# Patient Record
Sex: Female | Born: 1995 | Race: Black or African American | Hispanic: No | Marital: Single | Smoking: Current some day smoker
Health system: Southern US, Community
[De-identification: ages and names within clinical notes are randomized; demographics above are authoritative.]

## PROBLEM LIST (undated history)

## (undated) ENCOUNTER — Emergency Department (HOSPITAL_COMMUNITY): Admission: EM | Payer: Self-pay | Source: Home / Self Care

## (undated) HISTORY — PX: TONSILLECTOMY: SUR1361

---

## 2016-10-01 ENCOUNTER — Encounter (HOSPITAL_COMMUNITY): Payer: Self-pay | Admitting: Emergency Medicine

## 2016-10-01 ENCOUNTER — Emergency Department (HOSPITAL_COMMUNITY)
Admission: EM | Admit: 2016-10-01 | Discharge: 2016-10-01 | Discharge status: Against Medical Advice | Payer: Self-pay | Attending: Emergency Medicine | Admitting: Emergency Medicine

## 2016-10-01 ENCOUNTER — Emergency Department (HOSPITAL_COMMUNITY)
Admission: EM | Admit: 2016-10-01 | Discharge: 2016-10-01 | Disposition: A | Discharge status: Home / Self Care | Payer: Self-pay | Attending: Emergency Medicine | Admitting: Emergency Medicine

## 2016-10-01 DIAGNOSIS — R070 Pain in throat: Secondary | ICD-10-CM | POA: Insufficient documentation

## 2016-10-01 DIAGNOSIS — Z5321 Procedure and treatment not carried out due to patient leaving prior to being seen by health care provider: Secondary | ICD-10-CM | POA: Insufficient documentation

## 2016-10-01 DIAGNOSIS — J029 Acute pharyngitis, unspecified: Secondary | ICD-10-CM | POA: Insufficient documentation

## 2016-10-01 LAB — RAPID STREP SCREEN (MED CTR MEBANE ONLY): STREPTOCOCCUS, GROUP A SCREEN (DIRECT): NEGATIVE

## 2016-10-01 NOTE — ED Notes (Signed)
Per registration, patient stated that she was "going somewhere else" and gave registration labels and armband.

## 2016-10-01 NOTE — ED Triage Notes (Signed)
Patient states she has been having throat discomfort since the beginning of the week. Pt states she woke up around 2am this morning and felt like she couldn't swallow because it was swollen and painful. Patient was seen in tri[facility] but left and came here due to the wait. Patients o2 sat 100% on RA. Pt reports fever of 100.4 at [facility]. Temp now reading 98.1 oral.

## 2016-10-01 NOTE — ED Notes (Signed)
Pt returned stickers and told registration she was leaving

## 2016-10-01 NOTE — ED Triage Notes (Addendum)
C/o nasal congestion, feeling like throat is swollen, and difficulty swallowing since Monday.  Denies known fever.

## 2016-10-03 LAB — CULTURE, GROUP A STREP (THRC)

## 2016-11-04 ENCOUNTER — Ambulatory Visit (HOSPITAL_COMMUNITY)
Admission: EM | Admit: 2016-11-04 | Discharge: 2016-11-04 | Disposition: A | Discharge status: Home / Self Care | Payer: BLUE CROSS/BLUE SHIELD | Attending: Internal Medicine | Admitting: Internal Medicine

## 2016-11-04 ENCOUNTER — Encounter (HOSPITAL_COMMUNITY): Payer: Self-pay | Admitting: Emergency Medicine

## 2016-11-04 DIAGNOSIS — B354 Tinea corporis: Secondary | ICD-10-CM | POA: Diagnosis not present

## 2016-11-04 MED ORDER — TERBINAFINE HCL 250 MG PO TABS: 250.0000 mg | ORAL_TABLET | Freq: Every day | ORAL | 0 refills | Status: DC

## 2016-11-04 NOTE — ED Triage Notes (Signed)
Pt here for rash inside bilateral thighs onset 2 weeks  Was taking OTC meds for a yeast inf recently   Was also taking OTC jock itch cream w/no relief.   A&O x4... NAD... Ambulatory

## 2016-11-04 NOTE — Discharge Instructions (Signed)
°  No Primary Care Doctor: Call [facility] at  [phone] - they can help you locate a primary care doctor that  accepts your insurance, provides certain services, etc. Physician Referral Service- [phone]   You can continue to use over the counter jock itch cream while also taking the oral medication prescribed today to help with the rash.

## 2016-11-04 NOTE — ED Provider Notes (Signed)
[facility]    CSN: [id number] Arrival date & time: 11/04/16  1042     History   Chief Complaint Chief Complaint  Patient presents with  . Rash    HPI Claire Wilson is a 21 y.o. female.   HPI  Claire Wilson is a 21 y.o. female presenting to UC with c/o 1-2 week hx of itching irritated rash between bilateral thighs.  She has tried OTC medications for yeast and jock itch cream for about 1 week w/o relief.  Denies concern for STD. Denies new soaps, lotions, or medications.    History reviewed. No pertinent past medical history.  There are no active problems to display for this patient.   Past Surgical History:  Procedure Laterality Date  . TONSILLECTOMY      OB History    No data available       Home Medications    Prior to Admission medications   Medication Sig Start Date End Date Taking? Authorizing Provider  medroxyPROGESTERone (DEPO-PROVERA) 150 MG/ML injection Inject 150 mg into the muscle every 3 (three) months.   Yes [provider]  terbinafine (LAMISIL) 250 MG tablet Take 1 tablet (250 mg total) by mouth daily. For 2-4 weeks 11/04/16   Rolla Plate    Family History History reviewed. No pertinent family history.  Social History Social History  Substance Use Topics  . Smoking status: Current Some Day Smoker    Types: Cigars  . Smokeless tobacco: Never Used  . Alcohol use Yes     Allergies   Patient has no known allergies.   Review of Systems Review of Systems  Constitutional: Negative for chills and fever.  Genitourinary: Negative for dysuria, flank pain and frequency.  Skin: Positive for color change and rash. Negative for wound.     Physical Exam Triage Vital Signs ED Triage Vitals [11/04/16 1104]  Enc Vitals Group     BP 130/69     Pulse Rate 93     Resp 18     Temp 98.9 F (37.2 C)     Temp Source Oral     SpO2 99 %     Weight      Height      Head Circumference      Peak Flow      Pain Score       Pain Loc      Pain Edu?      Excl. in GC?    No data found.   Updated Vital Signs BP 130/69 (BP Location: Right Arm)   Pulse 93   Temp 98.9 F (37.2 C) (Oral)   Resp 18   SpO2 99%   Visual Acuity Right Eye Distance:   Left Eye Distance:   Bilateral Distance:    Right Eye Near:   Left Eye Near:    Bilateral Near:     Physical Exam  Constitutional: She is oriented to person, place, and time. She appears well-developed and well-nourished.  HENT:  Head: Normocephalic and atraumatic.  Eyes: EOM are normal.  Neck: Normal range of motion.  Cardiovascular: Normal rate.   Pulmonary/Chest: Effort normal.  Musculoskeletal: Normal range of motion.  Neurological: She is alert and oriented to person, place, and time.  Skin: Skin is warm and dry. Rash noted.  Bilateral medial thighs: hypopigmented lesions with well defined borders. Non-tender. No induration or fluctuance. No bleeding or drainage. No vesicles.  Psychiatric: She has a normal mood and affect. Her behavior  is normal.  Nursing note and vitals reviewed.    UC Treatments / Results  Labs (all labs ordered are listed, but only abnormal results are displayed) Labs Reviewed - No data to display  EKG  EKG Interpretation None       Radiology No results found.  Procedures Procedures (including critical care time)  Medications Ordered in UC Medications - No data to display   Initial Impression / Assessment and Plan / UC Course  I have reviewed the triage vital signs and the nursing notes.  Pertinent labs & imaging results that were available during my care of the patient were reviewed by me and considered in my medical decision making (see chart for details).    Hx and exam c/w tinea corporis. Will start pt on oral terbinafine  Home care instructions provided Encouraged f/u with PCP in 2-3 weeks if not improving ,sooner if worsening.   Final Clinical Impressions(s) / UC Diagnoses   Final diagnoses:    Tinea corporis    New Prescriptions Discharge Medication List as of 11/04/2016 11:17 AM    START taking these medications   Details  terbinafine (LAMISIL) 250 MG tablet Take 1 tablet (250 mg total) by mouth daily. For 2-4 weeks, Starting Fri 11/04/2016, Normal         Controlled Substance Prescriptions [place] Controlled Substance Registry consulted? Not Applicable   Rolla Plate 11/04/16 1156

## 2017-05-19 ENCOUNTER — Ambulatory Visit (HOSPITAL_COMMUNITY)
Admission: EM | Admit: 2017-05-19 | Discharge: 2017-05-19 | Disposition: A | Discharge status: Home / Self Care | Payer: BLUE CROSS/BLUE SHIELD | Attending: Family Medicine | Admitting: Family Medicine

## 2017-05-19 ENCOUNTER — Encounter (HOSPITAL_COMMUNITY): Payer: Self-pay | Admitting: Family Medicine

## 2017-05-19 DIAGNOSIS — Z041 Encounter for examination and observation following transport accident: Secondary | ICD-10-CM
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
## 2017-08-18 ENCOUNTER — Ambulatory Visit (HOSPITAL_COMMUNITY)
Admission: EM | Admit: 2017-08-18 | Discharge: 2017-08-18 | Disposition: A | Discharge status: Home / Self Care | Payer: 59 | Attending: Internal Medicine | Admitting: Internal Medicine

## 2017-08-18 ENCOUNTER — Encounter (HOSPITAL_COMMUNITY): Payer: Self-pay

## 2017-08-18 DIAGNOSIS — N898 Other specified noninflammatory disorders of vagina: Secondary | ICD-10-CM | POA: Diagnosis not present

## 2017-08-18 LAB — POCT URINALYSIS DIP (DEVICE)
BILIRUBIN URINE: NEGATIVE
Glucose, UA: NEGATIVE mg/dL
KETONES UR: NEGATIVE mg/dL
LEUKOCYTES UA: NEGATIVE
Nitrite: NEGATIVE
Protein, ur: NEGATIVE mg/dL
SPECIFIC GRAVITY, URINE: 1.02 (ref 1.005–1.030)
Urobilinogen, UA: 0.2 mg/dL (ref 0.0–1.0)
pH: 5.5 (ref 5.0–8.0)

## 2017-08-18 LAB — POCT PREGNANCY, URINE: Preg Test, Ur: NEGATIVE

## 2017-08-18 MED ORDER — METRONIDAZOLE 500 MG PO TABS: 500.0000 mg | ORAL_TABLET | Freq: Two times a day (BID) | ORAL | 0 refills | Status: AC

## 2017-08-18 MED ORDER — FLUCONAZOLE 150 MG PO TABS: 150.0000 mg | ORAL_TABLET | Freq: Once | ORAL | 0 refills | Status: AC

## 2017-08-18 NOTE — ED Triage Notes (Signed)
Pt presents with complaints of vaginal itching and discharge x 5 days.

## 2017-08-18 NOTE — Discharge Instructions (Addendum)
We are going to go ahead and treat you for yeast and BV.  Please take 1 tablet of Diflucan today, begin metronidazole twice daily for the next week.  May repeat second tablet of Diflucan after completion of metronidazole if still having symptoms.  Please do not drink alcohol while taking metronidazole.  We are testing you for Gonorrhea, Chlamydia, Trichomonas, Yeast and Bacterial Vaginosis. We will call you if anything is positive and let you know if you require any further treatment. Please inform partners of any positive results.   Please return if symptoms not improving with treatment, development of fever, nausea, vomiting, abdominal pain.

## 2017-08-18 NOTE — ED Provider Notes (Signed)
[facility]    CSN: [id number] Arrival date & time: 08/18/17  1125     History   Chief Complaint Chief Complaint  Patient presents with  . Vaginal Discharge    HPI Claire Wilson is a 22 y.o. female no significant past medical history presenting today for evaluation of vaginal discharge and itching.  Patient states that since Monday, for the past 5 days she has had itching, burning as well as discharge.  She tried using Monistat, but symptoms have not fully resolved.  States that she is unsure when her last menstrual period was but is typically irregular.  She is not on any current form of birth control now, but is previously been on Depo and Nexplanon.  She denies any fever, nausea, vomiting, abdominal pain, back pain.  Denies associated urinary symptoms.  Patient is sexually active, denies any new partners recently.  HPI  History reviewed. No pertinent past medical history.  There are no active problems to display for this patient.   Past Surgical History:  Procedure Laterality Date  . TONSILLECTOMY      OB History   None      Home Medications    Prior to Admission medications   Medication Sig Start Date End Date Taking? Authorizing Provider  medroxyPROGESTERone (DEPO-PROVERA) 150 MG/ML injection Inject 150 mg into the muscle every 3 (three) months.   Yes [provider]  fluconazole (DIFLUCAN) 150 MG tablet Take 1 tablet (150 mg total) by mouth once for 1 dose. 08/18/17 08/18/17  Felishia Wartman C, PA-C  metroNIDAZOLE (FLAGYL) 500 MG tablet Take 1 tablet (500 mg total) by mouth 2 (two) times daily for 7 days. Do not drink alcohol while taking 08/18/17 08/25/17  Zianna Dercole C, PA-C  terbinafine (LAMISIL) 250 MG tablet Take 1 tablet (250 mg total) by mouth daily. For 2-4 weeks 11/04/16   Rolla Plate    Family History Family History  Problem Relation Age of Onset  . Healthy Mother   . Healthy Father     Social History Social History     Tobacco Use  . Smoking status: Current Some Day Smoker    Types: Cigars  . Smokeless tobacco: Never Used  Substance Use Topics  . Alcohol use: Yes  . Drug use: No     Allergies   Patient has no known allergies.   Review of Systems Review of Systems  Constitutional: Negative for fever.  Respiratory: Negative for shortness of breath.   Cardiovascular: Negative for chest pain.  Gastrointestinal: Negative for abdominal pain, diarrhea, nausea and vomiting.  Genitourinary: Positive for vaginal discharge. Negative for dysuria, flank pain, frequency, genital sores, hematuria, menstrual problem, vaginal bleeding and vaginal pain.  Musculoskeletal: Negative for back pain.  Skin: Negative for rash.  Neurological: Negative for dizziness, light-headedness and headaches.     Physical Exam Triage Vital Signs ED Triage Vitals  Enc Vitals Group     BP 08/18/17 1137 113/66     Pulse Rate 08/18/17 1137 80     Resp 08/18/17 1137 16     Temp 08/18/17 1137 98.2 F (36.8 C)     Temp Source 08/18/17 1137 Oral     SpO2 08/18/17 1137 100 %     Weight --      Height --      Head Circumference --      Peak Flow --      Pain Score 08/18/17 1141 0     Pain Loc --  Pain Edu? --      Excl. in GC? --    No data found.  Updated Vital Signs BP 113/66 (BP Location: Right Arm)   Pulse 80   Temp 98.2 F (36.8 C) (Oral)   Resp 16   LMP 06/18/2017   SpO2 100%   Visual Acuity Right Eye Distance:   Left Eye Distance:   Bilateral Distance:    Right Eye Near:   Left Eye Near:    Bilateral Near:     Physical Exam  Constitutional: She is oriented to person, place, and time. She appears well-developed and well-nourished.  No acute distress  HENT:  Head: Normocephalic and atraumatic.  Nose: Nose normal.  Eyes: Conjunctivae are normal.  Neck: Neck supple.  Cardiovascular: Normal rate.  Pulmonary/Chest: Effort normal. No respiratory distress.  Abdominal: She exhibits no distension.   Nontender to light deep palpation throughout all 4 quadrants and epigastrium Negative CVA tenderness  Genitourinary:  Genitourinary Comments: Deferred given without pain and denies rashes or lesions  Musculoskeletal: Normal range of motion.  Neurological: She is alert and oriented to person, place, and time.  Skin: Skin is warm and dry.  Psychiatric: She has a normal mood and affect.  Nursing note and vitals reviewed.    UC Treatments / Results  Labs (all labs ordered are listed, but only abnormal results are displayed) Labs Reviewed  POCT URINALYSIS DIP (DEVICE) - Abnormal; Notable for the following components:      Result Value   Hgb urine dipstick SMALL (*)    All other components within normal limits  POCT PREGNANCY, URINE  CERVICOVAGINAL ANCILLARY ONLY    EKG None  Radiology No results found.  Procedures Procedures (including critical care time)  Medications Ordered in UC Medications - No data to display  Initial Impression / Assessment and Plan / UC Course  I have reviewed the triage vital signs and the nursing notes.  Pertinent labs & imaging results that were available during my care of the patient were reviewed by me and considered in my medical decision making (see chart for details).      Patient with vaginal discharge and itching, does have a history of BV.  Pregnancy test negative.  We will go ahead and treat for yeast and BV today.  Vaginal swab obtained will call and alter treatment as needed.  Will inform patient of any positive results.  Metronidazole and Diflucan prescribed today.Discussed strict return precautions. Patient verbalized understanding and is agreeable with plan.  Final Clinical Impressions(s) / UC Diagnoses   Final diagnoses:  Vaginal discharge     Discharge Instructions     We are going to go ahead and treat you for yeast and BV.  Please take 1 tablet of Diflucan today, begin metronidazole twice daily for the next week.  May  repeat second tablet of Diflucan after completion of metronidazole if still having symptoms.  Please do not drink alcohol while taking metronidazole.  We are testing you for Gonorrhea, Chlamydia, Trichomonas, Yeast and Bacterial Vaginosis. We will call you if anything is positive and let you know if you require any further treatment. Please inform partners of any positive results.   Please return if symptoms not improving with treatment, development of fever, nausea, vomiting, abdominal pain.     ED Prescriptions    Medication Sig Dispense Auth. Provider   fluconazole (DIFLUCAN) 150 MG tablet Take 1 tablet (150 mg total) by mouth once for 1 dose. 2 tablet Tyriek Hofman, [place]  C, PA-C   metroNIDAZOLE (FLAGYL) 500 MG tablet Take 1 tablet (500 mg total) by mouth 2 (two) times daily for 7 days. Do not drink alcohol while taking 14 tablet Felita Bump C, PA-C     Controlled Substance Prescriptions [place] Controlled Substance Registry consulted? Not Applicable   Lew DawesWieters, Jaydee Conran C, [place]PA-C 08/18/17 1201

## 2017-08-21 LAB — CERVICOVAGINAL ANCILLARY ONLY
Bacterial vaginitis: NEGATIVE
CHLAMYDIA, DNA PROBE: NEGATIVE
Candida vaginitis: NEGATIVE
NEISSERIA GONORRHEA: NEGATIVE
TRICH (WINDOWPATH): NEGATIVE

## 2017-11-04 ENCOUNTER — Emergency Department (HOSPITAL_COMMUNITY)
Admission: EM | Admit: 2017-11-04 | Discharge: 2017-11-05 | Disposition: A | Discharge status: Home / Self Care | Payer: 59 | Attending: Emergency Medicine | Admitting: Emergency Medicine

## 2017-11-04 ENCOUNTER — Emergency Department (HOSPITAL_COMMUNITY): Payer: 59

## 2017-11-04 ENCOUNTER — Other Ambulatory Visit: Payer: Self-pay

## 2017-11-04 DIAGNOSIS — F17210 Nicotine dependence, cigarettes, uncomplicated: Secondary | ICD-10-CM | POA: Insufficient documentation

## 2017-11-04 DIAGNOSIS — R0981 Nasal congestion: Secondary | ICD-10-CM | POA: Diagnosis present

## 2017-11-04 DIAGNOSIS — B9689 Other specified bacterial agents as the cause of diseases classified elsewhere: Secondary | ICD-10-CM

## 2017-11-04 DIAGNOSIS — J069 Acute upper respiratory infection, unspecified: Secondary | ICD-10-CM | POA: Diagnosis not present

## 2017-11-04 DIAGNOSIS — N760 Acute vaginitis: Secondary | ICD-10-CM | POA: Insufficient documentation

## 2017-11-04 DIAGNOSIS — Z79899 Other long term (current) drug therapy: Secondary | ICD-10-CM | POA: Insufficient documentation

## 2017-11-04 LAB — URINALYSIS, ROUTINE W REFLEX MICROSCOPIC
BILIRUBIN URINE: NEGATIVE
Bacteria, UA: NONE SEEN
GLUCOSE, UA: NEGATIVE mg/dL
KETONES UR: NEGATIVE mg/dL
Leukocytes, UA: NEGATIVE
NITRITE: NEGATIVE
PH: 8 (ref 5.0–8.0)
Protein, ur: NEGATIVE mg/dL
Specific Gravity, Urine: 1.012 (ref 1.005–1.030)

## 2017-11-04 LAB — I-STAT BETA HCG BLOOD, ED (MC, WL, AP ONLY): I-stat hCG, quantitative: <5 m[IU]/mL (ref <5)

## 2017-11-04 MED ORDER — ACETAMINOPHEN 325 MG PO TABS
650.0000 mg | ORAL_TABLET | Freq: Once | ORAL | Status: AC | PRN
  Administered 2017-11-04: 650 mg via ORAL
  Filled 2017-11-04: qty 2

## 2017-11-04 NOTE — ED Triage Notes (Signed)
Patient c/o congestion, nasal drainage, scratchy throat since yesterday. Patient also c/o vaginal discharge also.

## 2017-11-05 LAB — COMPREHENSIVE METABOLIC PANEL
ALK PHOS: 76 U/L (ref 38–126)
ALT: 14 U/L (ref 0–44)
AST: 17 U/L (ref 15–41)
Albumin: 4 g/dL (ref 3.5–5.0)
Anion gap: 7 (ref 5–15)
BUN: 8 mg/dL (ref 6–20)
CALCIUM: 9.2 mg/dL (ref 8.9–10.3)
CHLORIDE: 106 mmol/L (ref 98–111)
CO2: 24 mmol/L (ref 22–32)
CREATININE: 0.88 mg/dL (ref 0.44–1.00)
Glucose, Bld: 76 mg/dL (ref 70–99)
Potassium: 3.6 mmol/L (ref 3.5–5.1)
Sodium: 137 mmol/L (ref 135–145)
Total Bilirubin: 0.5 mg/dL (ref 0.3–1.2)
Total Protein: 7.2 g/dL (ref 6.5–8.1)

## 2017-11-05 LAB — CBC WITH DIFFERENTIAL/PLATELET
ABS IMMATURE GRANULOCYTES: 0 10*3/uL (ref 0.0–0.1)
BASOS PCT: 0 %
Basophils Absolute: 0.1 10*3/uL (ref 0.0–0.1)
EOS ABS: 0.4 10*3/uL (ref 0.0–0.7)
Eosinophils Relative: 3 %
HCT: 41.4 % (ref 36.0–46.0)
Hemoglobin: 13.2 g/dL (ref 12.0–15.0)
IMMATURE GRANULOCYTES: 0 %
Lymphocytes Relative: 18 %
Lymphs Abs: 2.3 10*3/uL (ref 0.7–4.0)
MCH: 28.6 pg (ref 26.0–34.0)
MCHC: 31.9 g/dL (ref 30.0–36.0)
MCV: 89.8 fL (ref 78.0–100.0)
Monocytes Absolute: 1.4 10*3/uL — ABNORMAL HIGH (ref 0.1–1.0)
Monocytes Relative: 10 %
NEUTROS ABS: 9.2 10*3/uL — AB (ref 1.7–7.7)
Neutrophils Relative %: 69 %
PLATELETS: 271 10*3/uL (ref 150–400)
RBC: 4.61 MIL/uL (ref 3.87–5.11)
RDW: 12.5 % (ref 11.5–15.5)
WBC: 13.4 10*3/uL — AB (ref 4.0–10.5)

## 2017-11-05 LAB — WET PREP, GENITAL
SPERM: NONE SEEN
TRICH WET PREP: NONE SEEN
Yeast Wet Prep HPF POC: NONE SEEN

## 2017-11-05 MED ORDER — IBUPROFEN 800 MG PO TABS
800.0000 mg | ORAL_TABLET | Freq: Once | ORAL | Status: AC
  Administered 2017-11-05: 800 mg via ORAL
  Filled 2017-11-05: qty 1

## 2017-11-05 MED ORDER — BENZONATATE 100 MG PO CAPS: 100.0000 mg | ORAL_CAPSULE | Freq: Three times a day (TID) | ORAL | 0 refills | Status: DC | PRN

## 2017-11-05 MED ORDER — CETIRIZINE HCL 10 MG PO TABS: 10.0000 mg | ORAL_TABLET | Freq: Every day | ORAL | 0 refills | Status: DC

## 2017-11-05 MED ORDER — IBUPROFEN 600 MG PO TABS: 600.0000 mg | ORAL_TABLET | Freq: Four times a day (QID) | ORAL | 0 refills | Status: DC | PRN

## 2017-11-05 MED ORDER — METRONIDAZOLE 0.75 % VA GEL: 1.0000 | Freq: Two times a day (BID) | VAGINAL | 0 refills | Status: DC

## 2017-11-06 LAB — GC/CHLAMYDIA PROBE AMP (~~LOC~~) NOT AT ARMC
Chlamydia: NEGATIVE
NEISSERIA GONORRHEA: NEGATIVE

## 2017-11-11 NOTE — ED Provider Notes (Signed)
[facility] EMERGENCY DEPARTMENT Provider Note   CSN: [id number] Arrival date & time: 11/04/17  2202    History   Chief Complaint Chief Complaint  Patient presents with  . URI  . Vaginal Discharge    HPI Claire Wilson is a 22 y.o. female.  The history is provided by the patient. No language interpreter was used.  URI   This is a new problem. The current episode started yesterday. The problem has not changed since onset.There has been no fever. Associated symptoms include congestion, sinus pain, sneezing and sore throat. Pertinent negatives include no diarrhea, no vomiting and no wheezing. She has tried nothing for the symptoms. The treatment provided no relief.  Vaginal Discharge   This is a new problem. The problem occurs constantly. The problem has not changed since onset.The discharge occurs spontaneously. The discharge was white. She is not pregnant. Pertinent negatives include no diarrhea and no vomiting. She has tried nothing for the symptoms. The treatment provided no relief. Her past medical history does not include STD.    No past medical history on file.  There are no active problems to display for this patient.   Past Surgical History:  Procedure Laterality Date  . TONSILLECTOMY       OB History   None      Home Medications    Prior to Admission medications   Medication Sig Start Date End Date Taking? Authorizing Provider  benzonatate (TESSALON) 100 MG capsule Take 1 capsule (100 mg total) by mouth 3 (three) times daily as needed for cough. 11/05/17   Antony Madura, PA-C  cetirizine (ZYRTEC) 10 MG tablet Take 1 tablet (10 mg total) by mouth daily. 11/05/17   Antony Madura, PA-C  ibuprofen (ADVIL,MOTRIN) 600 MG tablet Take 1 tablet (600 mg total) by mouth every 6 (six) hours as needed for fever, headache, mild pain or moderate pain. 11/05/17   Antony Madura, PA-C  medroxyPROGESTERone (DEPO-PROVERA) 150 MG/ML injection Inject 150 mg into the  muscle every 3 (three) months.    [provider]  metroNIDAZOLE (METROGEL VAGINAL) 0.75 % vaginal gel Place 1 Applicatorful vaginally 2 (two) times daily. 11/05/17   Antony Madura, PA-C  terbinafine (LAMISIL) 250 MG tablet Take 1 tablet (250 mg total) by mouth daily. For 2-4 weeks 11/04/16   Rolla Plate    Family History Family History  Problem Relation Age of Onset  . Healthy Mother   . Healthy Father     Social History Social History   Tobacco Use  . Smoking status: Current Some Day Smoker    Types: Cigars  . Smokeless tobacco: Never Used  Substance Use Topics  . Alcohol use: Yes  . Drug use: No     Allergies   Patient has no known allergies.   Review of Systems Review of Systems  HENT: Positive for congestion, sinus pain, sneezing and sore throat.   Respiratory: Negative for wheezing.   Gastrointestinal: Negative for diarrhea and vomiting.  Genitourinary: Positive for vaginal discharge.  Ten systems reviewed and are negative for acute change, except as noted in the HPI.    Physical Exam Updated Vital Signs BP 103/70   Pulse 66   Temp 98.6 F (37 C) (Oral)   Resp 14   Ht 5\' 4"  (1.626 m)   Wt 65.8 kg   SpO2 100%   BMI 24.89 kg/m   Physical Exam  Constitutional: She is oriented to person, place, and time. She appears well-developed  and well-nourished. No distress.  Nontoxic appearing and in NAD.   HENT:  Head: Normocephalic and atraumatic.  Mouth/Throat: Oropharynx is clear and moist.  Audible nasal congestion. Oropharynx clear. Tolerating secretions.  Eyes: Conjunctivae and EOM are normal. No scleral icterus.  Neck: Normal range of motion.  Cardiovascular: Normal rate, regular rhythm and intact distal pulses.  Pulmonary/Chest: Effort normal. No stridor. No respiratory distress. She has no wheezes. She has no rales.  Respirations even and unlabored  Genitourinary: There is no rash, tenderness or lesion on the right labia. There is no  rash, tenderness or lesion on the left labia. Cervix exhibits no motion tenderness. No signs of injury around the vagina. Vaginal discharge found.  Genitourinary Comments: Scant amount of white discharge in the vaginal vault. No CMT.  Musculoskeletal: Normal range of motion.  Neurological: She is alert and oriented to person, place, and time.  Skin: Skin is warm and dry. No rash noted. She is not diaphoretic. No erythema. No pallor.  Psychiatric: She has a normal mood and affect. Her behavior is normal.  Nursing note and vitals reviewed.    ED Treatments / Results  Labs (all labs ordered are listed, but only abnormal results are displayed) Labs Reviewed  WET PREP, GENITAL - Abnormal; Notable for the following components:      Result Value   Clue Cells Wet Prep HPF POC PRESENT (*)    WBC, Wet Prep HPF POC MODERATE (*)    All other components within normal limits  CBC WITH DIFFERENTIAL/PLATELET - Abnormal; Notable for the following components:   WBC 13.4 (*)    Neutro Abs 9.2 (*)    Monocytes Absolute 1.4 (*)    All other components within normal limits  URINALYSIS, ROUTINE W REFLEX MICROSCOPIC - Abnormal; Notable for the following components:   Color, Urine STRAW (*)    Hgb urine dipstick MODERATE (*)    All other components within normal limits  COMPREHENSIVE METABOLIC PANEL  I-STAT BETA HCG BLOOD, ED ([facility], [facility], [facility] ONLY)  GC/CHLAMYDIA PROBE AMP ([place]) NOT AT [facility]    EKG None  Radiology Dg Chest 2 View  Result Date: 11/04/2017 CLINICAL DATA:  22 year old female with cough. EXAM: CHEST - 2 VIEW COMPARISON:  None. FINDINGS: The heart size and mediastinal contours are within normal limits. Both lungs are clear. The visualized skeletal structures are unremarkable. IMPRESSION: No active cardiopulmonary disease. Electronically Signed   By: Elgie Collard M.D.   On: 11/04/2017 23:03     Procedures Procedures (including critical care time)  Medications Ordered in  ED Medications  acetaminophen (TYLENOL) tablet 650 mg (650 mg Oral Given 11/04/17 2223)  ibuprofen (ADVIL,MOTRIN) tablet 800 mg (800 mg Oral Given 11/05/17 0237)     Initial Impression / Assessment and Plan / ED Course  I have reviewed the triage vital signs and the nursing notes.  Pertinent labs & imaging results that were available during my care of the patient were reviewed by me and considered in my medical decision making (see chart for details).     Pt CXR negative for acute infiltrate. Patients symptoms are consistent with URI, likely viral etiology. Discussed that antibiotics are not indicated for viral infections. Pt will be discharged with symptomatic treatment.  Verbalizes understanding and is agreeable with plan.   Also noting vaginal discharge over the past few days.  She is noted to have clue cells on wet prep today.  Will discharge with MetroGel for management of presumed bacterial vaginosis.  Gonorrhea and Chlamydia cultures pending.  Patient able to follow-up with the health department regarding the results of these STD test.  Return precautions discussed and provided. Patient discharged in stable condition with no unaddressed concerns.   Final Clinical Impressions(s) / ED Diagnoses   Final diagnoses:  Viral upper respiratory tract infection  BV (bacterial vaginosis)    ED Discharge Orders         Ordered    metroNIDAZOLE (METROGEL VAGINAL) 0.75 % vaginal gel  2 times daily     11/05/17 0304    benzonatate (TESSALON) 100 MG capsule  3 times daily PRN     11/05/17 0304    cetirizine (ZYRTEC) 10 MG tablet  Daily     11/05/17 0304    ibuprofen (ADVIL,MOTRIN) 600 MG tablet  Every 6 hours PRN     11/05/17 0304           Antony Madura, PA-C 11/11/17 0621    Ward, Layla Maw, DO 11/12/17 2308

## 2017-12-11 ENCOUNTER — Encounter (HOSPITAL_COMMUNITY): Payer: Self-pay | Admitting: *Deleted

## 2017-12-11 ENCOUNTER — Other Ambulatory Visit: Payer: Self-pay

## 2017-12-11 ENCOUNTER — Ambulatory Visit (HOSPITAL_COMMUNITY)
Admission: EM | Admit: 2017-12-11 | Discharge: 2017-12-11 | Disposition: A | Discharge status: Home / Self Care | Payer: 59 | Attending: Family Medicine | Admitting: Family Medicine

## 2017-12-11 DIAGNOSIS — B9689 Other specified bacterial agents as the cause of diseases classified elsewhere: Secondary | ICD-10-CM | POA: Diagnosis not present

## 2017-12-11 DIAGNOSIS — N760 Acute vaginitis: Secondary | ICD-10-CM

## 2017-12-11 MED ORDER — FLUCONAZOLE 150 MG PO TABS: 150.0000 mg | ORAL_TABLET | Freq: Every day | ORAL | 0 refills | Status: DC

## 2017-12-11 MED ORDER — METRONIDAZOLE 500 MG PO TABS: 500.0000 mg | ORAL_TABLET | Freq: Two times a day (BID) | ORAL | 0 refills | Status: DC

## 2017-12-11 NOTE — ED Triage Notes (Signed)
C/o vaginal discharged 1 week ago, was in  ED for flu and was Rxc. Medication for vaginal problems however she couldn't afford medication.

## 2017-12-11 NOTE — ED Provider Notes (Signed)
[facility]    CSN: [id number] Arrival date & time: 12/11/17  1609     History   Chief Complaint Chief Complaint  Patient presents with  . Vaginal Discharge    HPI Claire Wilson is a 22 y.o. female.   22 year old female comes in for one-week history of vaginal discharge.  States was seen at the ED, and could not afford medication.  She has continued discharge with mild odor.  Denies vaginal itching, spotting.  Denies abdominal pain, nausea, vomiting.  Denies urinary symptoms recent frequency, dysuria, hematuria.  Denies fever, chills, night sweats.     History reviewed. No pertinent past medical history.  There are no active problems to display for this patient.   Past Surgical History:  Procedure Laterality Date  . TONSILLECTOMY      OB History   None      Home Medications    Prior to Admission medications   Medication Sig Start Date End Date Taking? Authorizing Provider  benzonatate (TESSALON) 100 MG capsule Take 1 capsule (100 mg total) by mouth 3 (three) times daily as needed for cough. 11/05/17   Antony Madura, PA-C  cetirizine (ZYRTEC) 10 MG tablet Take 1 tablet (10 mg total) by mouth daily. 11/05/17   Antony Madura, PA-C  fluconazole (DIFLUCAN) 150 MG tablet Take 1 tablet (150 mg total) by mouth daily. Take second dose 72 hours later if symptoms still persists. 12/11/17   Cathie Hoops, Mishawn Didion V, PA-C  ibuprofen (ADVIL,MOTRIN) 600 MG tablet Take 1 tablet (600 mg total) by mouth every 6 (six) hours as needed for fever, headache, mild pain or moderate pain. 11/05/17   Antony Madura, PA-C  medroxyPROGESTERone (DEPO-PROVERA) 150 MG/ML injection Inject 150 mg into the muscle every 3 (three) months.    [provider]  metroNIDAZOLE (FLAGYL) 500 MG tablet Take 1 tablet (500 mg total) by mouth 2 (two) times daily. 12/11/17   Cathie Hoops, Caleb Prigmore V, PA-C  metroNIDAZOLE (METROGEL VAGINAL) 0.75 % vaginal gel Place 1 Applicatorful vaginally 2 (two) times daily. 11/05/17   Antony Madura,  PA-C  terbinafine (LAMISIL) 250 MG tablet Take 1 tablet (250 mg total) by mouth daily. For 2-4 weeks 11/04/16   Rolla Plate    Family History Family History  Problem Relation Age of Onset  . Healthy Mother   . Healthy Father     Social History Social History   Tobacco Use  . Smoking status: Current Some Day Smoker    Types: Cigars  . Smokeless tobacco: Never Used  Substance Use Topics  . Alcohol use: Yes  . Drug use: No     Allergies   Patient has no known allergies.   Review of Systems Review of Systems  Reason unable to perform ROS: See HPI as above.     Physical Exam Triage Vital Signs ED Triage Vitals  Enc Vitals Group     BP 12/11/17 1638 (!) 115/55     Pulse Rate 12/11/17 1638 (!) 55     Resp 12/11/17 1638 14     Temp 12/11/17 1638 98 F (36.7 C)     Temp Source 12/11/17 1638 Oral     SpO2 12/11/17 1638 100 %     Weight --      Height --      Head Circumference --      Peak Flow --      Pain Score 12/11/17 1639 0     Pain Loc --  Pain Edu? --      Excl. in GC? --    No data found.  Updated Vital Signs BP (!) 115/55 (BP Location: Right Arm)   Pulse (!) 55   Temp 98 F (36.7 C) (Oral)   Resp 14   LMP  (LMP Unknown)   SpO2 100%   Physical Exam  Constitutional: She is oriented to person, place, and time. She appears well-developed and well-nourished. No distress.  HENT:  Head: Normocephalic and atraumatic.  Eyes: Pupils are equal, round, and reactive to light. Conjunctivae are normal.  Cardiovascular: Normal rate, regular rhythm and normal heart sounds. Exam reveals no gallop and no friction rub.  No murmur heard. Pulmonary/Chest: Effort normal and breath sounds normal. She has no wheezes. She has no rales.  Abdominal: Soft. Bowel sounds are normal. She exhibits no mass. There is no tenderness. There is no rebound, no guarding and no CVA tenderness.  Neurological: She is alert and oriented to person, place, and time.  Skin: Skin  is warm and dry.  Psychiatric: She has a normal mood and affect. Her behavior is normal. Judgment normal.     UC Treatments / Results  Labs (all labs ordered are listed, but only abnormal results are displayed) Labs Reviewed - No data to display  EKG None  Radiology No results found.  Procedures Procedures (including critical care time)  Medications Ordered in UC Medications - No data to display  Initial Impression / Assessment and Plan / UC Course  I have reviewed the triage vital signs and the nursing notes.  Pertinent labs & imaging results that were available during my care of the patient were reviewed by me and considered in my medical decision making (see chart for details).    Patient was seen in the emergency department 11/04/2017.  At that time, clue cells present on wet prep.  Negative GC, trichomoniasis.  Negative beta-hCG. MetroGel was called in at the time, which patient was unable to fill due to finances.  Will call in Flagyl as directed.  Patient states occasionally gets yeast infection after antibiotic, will call in Diflucan as needed.  Will defer cytology given recent testing.  Return precautions given.  Patient expresses understanding and agrees to plan.  Final Clinical Impressions(s) / UC Diagnoses   Final diagnoses:  BV (bacterial vaginosis)    ED Prescriptions    Medication Sig Dispense Auth. Provider   metroNIDAZOLE (FLAGYL) 500 MG tablet Take 1 tablet (500 mg total) by mouth 2 (two) times daily. 14 tablet Graelyn Bihl V, PA-C   fluconazole (DIFLUCAN) 150 MG tablet Take 1 tablet (150 mg total) by mouth daily. Take second dose 72 hours later if symptoms still persists. 2 tablet Threasa Alpha, PA-C 12/11/17 [phone]

## 2017-12-11 NOTE — Discharge Instructions (Signed)
Flagyl as directed to treat for Bv. I have also called in diflucan to cover for yeast, you can start to prevent yeast infection, or start if you have symptoms such as vaginal itching. Refrain from sexual activity and alcohol use for the next 7 days. Monitor for any worsening of symptoms, fever, abdominal pain, nausea, vomiting, to follow up for reevaluation.

## 2018-05-01 ENCOUNTER — Ambulatory Visit (HOSPITAL_COMMUNITY)
Admission: EM | Admit: 2018-05-01 | Discharge: 2018-05-01 | Disposition: A | Discharge status: Home / Self Care | Payer: 59 | Attending: Family Medicine | Admitting: Family Medicine

## 2018-05-01 ENCOUNTER — Other Ambulatory Visit: Payer: Self-pay

## 2018-05-01 ENCOUNTER — Encounter (HOSPITAL_COMMUNITY): Payer: Self-pay

## 2018-05-01 DIAGNOSIS — B9789 Other viral agents as the cause of diseases classified elsewhere: Secondary | ICD-10-CM | POA: Insufficient documentation

## 2018-05-01 DIAGNOSIS — J069 Acute upper respiratory infection, unspecified: Secondary | ICD-10-CM | POA: Insufficient documentation

## 2018-05-01 DIAGNOSIS — Z3202 Encounter for pregnancy test, result negative: Secondary | ICD-10-CM

## 2018-05-01 DIAGNOSIS — N898 Other specified noninflammatory disorders of vagina: Secondary | ICD-10-CM | POA: Insufficient documentation

## 2018-05-01 LAB — POCT PREGNANCY, URINE: Preg Test, Ur: NEGATIVE

## 2018-05-01 MED ORDER — AZITHROMYCIN 250 MG PO TABS
1000.0000 mg | ORAL_TABLET | Freq: Once | ORAL | Status: AC
  Administered 2018-05-01: 1000 mg via ORAL

## 2018-05-01 MED ORDER — CEFTRIAXONE SODIUM 250 MG IJ SOLR
250.0000 mg | Freq: Once | INTRAMUSCULAR | Status: AC
  Administered 2018-05-01: 250 mg via INTRAMUSCULAR

## 2018-05-01 MED ORDER — CETIRIZINE-PSEUDOEPHEDRINE ER 5-120 MG PO TB12: 1.0000 | ORAL_TABLET | Freq: Every day | ORAL | 0 refills | Status: AC

## 2018-05-01 MED ORDER — AZITHROMYCIN 250 MG PO TABS
ORAL_TABLET | ORAL | Status: AC
  Filled 2018-05-01: qty 4

## 2018-05-01 MED ORDER — METRONIDAZOLE 500 MG PO TABS: 500.0000 mg | ORAL_TABLET | Freq: Two times a day (BID) | ORAL | 0 refills | Status: AC

## 2018-05-01 MED ORDER — FLUTICASONE PROPIONATE 50 MCG/ACT NA SUSP: 2.0000 | Freq: Every day | NASAL | 0 refills | Status: AC

## 2018-05-01 MED ORDER — CEFTRIAXONE SODIUM 250 MG IJ SOLR
INTRAMUSCULAR | Status: AC
  Filled 2018-05-01: qty 250

## 2018-05-01 NOTE — ED Notes (Signed)
Patient verbalizes understanding of discharge instructions. Opportunity for questioning and answers were provided. Patient discharged from [facility] by CMA.  

## 2018-05-01 NOTE — Discharge Instructions (Signed)
Get plenty of rest and push fluids Zyrtec-D prescribed for nasal congestion, runny nose, and/or sore throat Flonase prescribed for nasal congestion and runny nose Use medications daily for symptom relief Use OTC medications like ibuprofen or tylenol as needed fever or pain Call or go to the ER if you have any new or worsening symptoms such as fever, chills, shortness of breath, chest tightness, altered mental status, nausea, vomiting, chest pain, cough, wheezing, abdominal pain, changes in bowel or bladder habits, etc...  Given rocephin 250mg  injection and azithromycin 1g in office Vaginal self swab obtained Declines HIV/ syphilis testing today Prescribed metronidazole 500 mg twice daily for 7 days (do not take while consuming alcohol) Take medication as prescribed and to completion We will follow up with you regarding the results of your test If tests are positive, please abstain from sexual activity for at least 7 days and notify partners Follow up with PCP or with Community health if symptoms persists Return here or go to ER if you have any new or worsening symptoms abdominal pain, urinary symptoms, pelvic pain, vaginal odor, vaginal rash or bumps, vaginal bleeding, etc..Marland Kitchen

## 2018-05-01 NOTE — ED Triage Notes (Signed)
[place] fever none. Pt cc she cough x 2 days  and vaginal discharge 1 week.

## 2018-05-01 NOTE — ED Provider Notes (Signed)
[facility]   [id number] 05/01/18 Arrival Time: 1523   CC: URI symptoms and vaginal discharge  SUBJECTIVE: History from: patient.  Claire Wilson is a 23 y.o. female who presents abrupt onset of scratchy throat, and sneezing, with associated dry cough that began this morning.  Admits to multiple sick exposures to relatives with flu, strep, and bronchitis.  Has NOT tried OTC medications.  Symptoms are made worse with smoking.  Denies fever, chills, fatigue, sinus pain, rhinorrhea, congestion,  SOB, wheezing, chest pain, nausea, changes in bowel or bladder habits.      Pt also mentions thick/ thin white vaginal discharge x 1 week.  Admits to changes in hygiene products as well as recent sexual activity 4 days ago.  Partner without symptoms.  Has not tried OTC medications.  Reports hx of BV x 2.  2 female partners within the last 6 months.  Denies urinary symptoms, pelvic pain, vaginal odor.    LMP January 2020 with irregular periods.  Not currently on BC.    ROS: As per HPI.  History reviewed. No pertinent past medical history. Past Surgical History:  Procedure Laterality Date  . TONSILLECTOMY     No Known Allergies No current facility-administered medications on file prior to encounter.    Current Outpatient Medications on File Prior to Encounter  Medication Sig Dispense Refill  . medroxyPROGESTERone (DEPO-PROVERA) 150 MG/ML injection Inject 150 mg into the muscle every 3 (three) months.     Social History   Socioeconomic History  . Marital status: Single    Spouse name: Not on file  . Number of children: Not on file  . Years of education: Not on file  . Highest education level: Not on file  Occupational History  . Not on file  Social Needs  . Financial resource strain: Not on file  . Food insecurity:    Worry: Not on file    Inability: Not on file  . Transportation needs:    Medical: Not on file    Non-medical: Not on file  Tobacco Use  . Smoking status:  Current Some Day Smoker    Types: Cigars  . Smokeless tobacco: Never Used  Substance and Sexual Activity  . Alcohol use: Yes  . Drug use: No  . Sexual activity: Not on file  Lifestyle  . Physical activity:    Days per week: Not on file    Minutes per session: Not on file  . Stress: Not on file  Relationships  . Social connections:    Talks on phone: Not on file    Gets together: Not on file    Attends religious service: Not on file    Active member of club or organization: Not on file    Attends meetings of clubs or organizations: Not on file    Relationship status: Not on file  . Intimate partner violence:    Fear of current or ex partner: Not on file    Emotionally abused: Not on file    Physically abused: Not on file    Forced sexual activity: Not on file  Other Topics Concern  . Not on file  Social History Narrative  . Not on file   Family History  Problem Relation Age of Onset  . Healthy Mother   . Healthy Father     OBJECTIVE:  Vitals:   05/01/18 1538 05/01/18 1539  BP: 111/67   Pulse: 93   Resp: 18   Temp: 99 F (37.2 C)  TempSrc: Oral   SpO2: 100%   Weight:  145 lb (65.8 kg)     General appearance: alert; appears mildly fatigued, but nontoxic; speaking in full sentences and tolerating own secretions HEENT: NCAT; Ears: EACs clear, TMs pearly gray; Eyes: PERRL.  EOM grossly intact. Nose: nares patent without rhinorrhea, Throat: oropharynx clear, tonsils non erythematous or enlarged, uvula midline  Neck: supple without LAD Lungs: unlabored respirations, symmetrical air entry; cough: absent; no respiratory distress; CTAB Heart: regular rate and rhythm.  Radial pulses 2+ symmetrical bilaterally GU: deferred; vaginal swab obtained Skin: warm and dry Psychological: alert and cooperative; normal mood and affect  Results for orders placed or performed during the hospital encounter of 05/01/18 (from the past 24 hour(s))  Pregnancy, urine POC     Status: None    Collection Time: 05/01/18  4:12 PM  Result Value Ref Range   Preg Test, Ur NEGATIVE NEGATIVE     ASSESSMENT & PLAN:  1. Viral URI with cough   2. Vaginal discharge     Meds ordered this encounter  Medications  . cefTRIAXone (ROCEPHIN) injection 250 mg    Order Specific Question:   Antibiotic Indication:    Answer:   STD  . azithromycin (ZITHROMAX) tablet 1,000 mg  . cetirizine-pseudoephedrine (ZYRTEC-D) 5-120 MG tablet    Sig: Take 1 tablet by mouth daily.    Dispense:  30 tablet    Refill:  0    Order Specific Question:   Supervising Provider    Answer:   Eustace Moore [[id number]]  . fluticasone (FLONASE) 50 MCG/ACT nasal spray    Sig: Place 2 sprays into both nostrils daily.    Dispense:  16 g    Refill:  0    Order Specific Question:   Supervising Provider    Answer:   Eustace Moore [[id number]]  . metroNIDAZOLE (FLAGYL) 500 MG tablet    Sig: Take 1 tablet (500 mg total) by mouth 2 (two) times daily.    Dispense:  14 tablet    Refill:  0    Order Specific Question:   Supervising Provider    Answer:   Eustace Moore [[id number]]    Get plenty of rest and push fluids Zyrtec-D prescribed for nasal congestion, runny nose, and/or sore throat Flonase prescribed for nasal congestion and runny nose Use medications daily for symptom relief Use OTC medications like ibuprofen or tylenol as needed fever or pain Call or go to the ER if you have any new or worsening symptoms such as fever, chills, shortness of breath, chest tightness, altered mental status, nausea, vomiting, chest pain, cough, wheezing, abdominal pain, changes in bowel or bladder habits, etc...  Given rocephin 250mg  injection and azithromycin 1g in office Vaginal self swab obtained Declines HIV/ syphilis testing today Prescribed metronidazole 500 mg twice daily for 7 days (do not take while consuming alcohol) Take medication as prescribed and to completion We will follow up with you regarding the  results of your test If tests are positive, please abstain from sexual activity for at least 7 days and notify partners Follow up with PCP or with Community health if symptoms persists Return here or go to ER if you have any new or worsening symptoms abdominal pain, urinary symptoms, pelvic pain, vaginal odor, vaginal rash or bumps, vaginal bleeding, etc...  Reviewed expectations re: course of current medical issues. Questions answered. Outlined signs and symptoms indicating need for more acute intervention. Patient verbalized understanding. After Visit Summary given.  Rennis Harding, PA-C 05/01/18 1729

## 2018-05-02 LAB — CERVICOVAGINAL ANCILLARY ONLY
CHLAMYDIA, DNA PROBE: NEGATIVE
Candida vaginitis: NEGATIVE
NEISSERIA GONORRHEA: NEGATIVE
TRICH (WINDOWPATH): NEGATIVE

## 2019-06-28 IMAGING — DX DG CHEST 2V
2 series · 2 of 2 positions shown · non-contrast
Comparison: None.

CLINICAL DATA: 22-year-old female with cough.

EXAM:
CHEST - 2 VIEW

[chest pa]
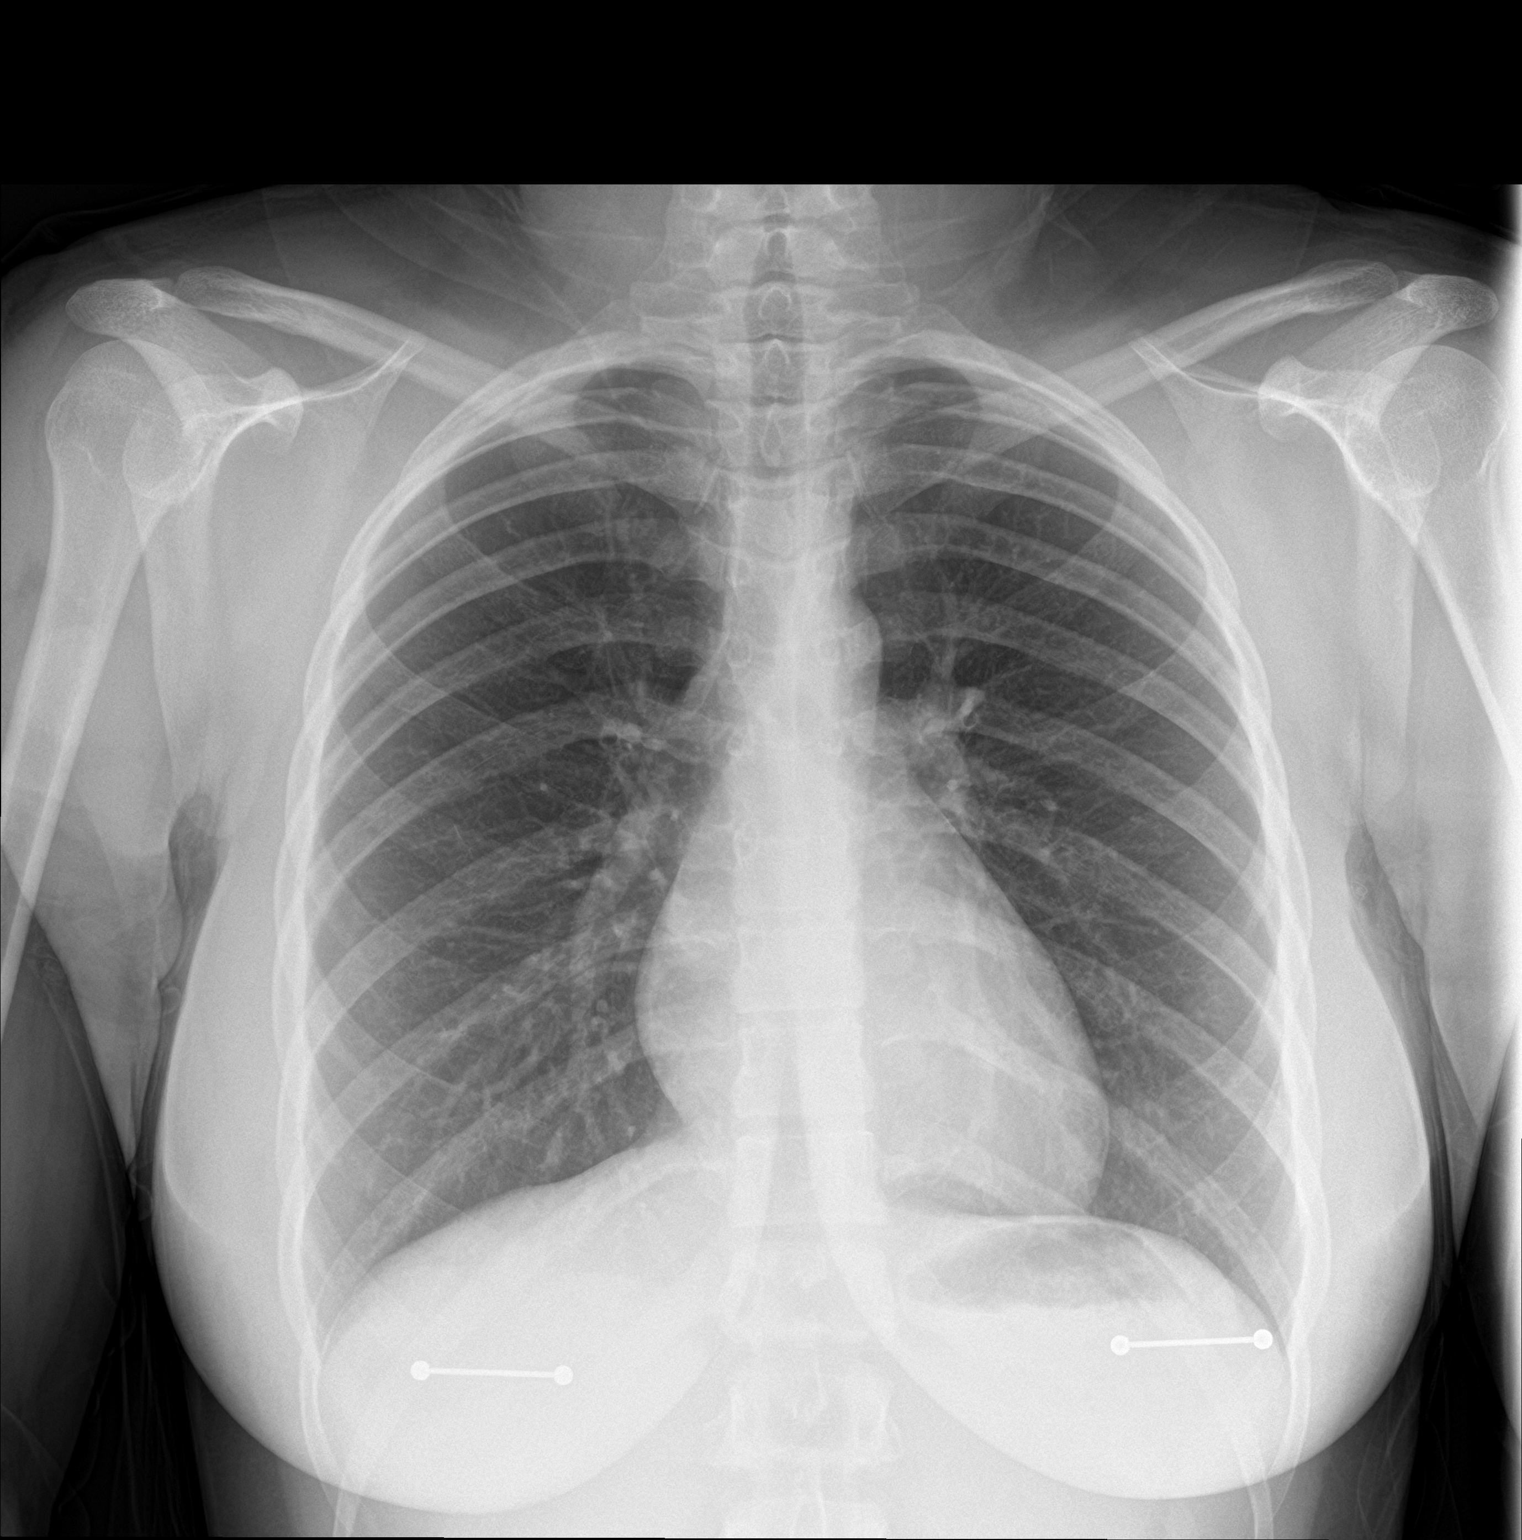

[chest lat]
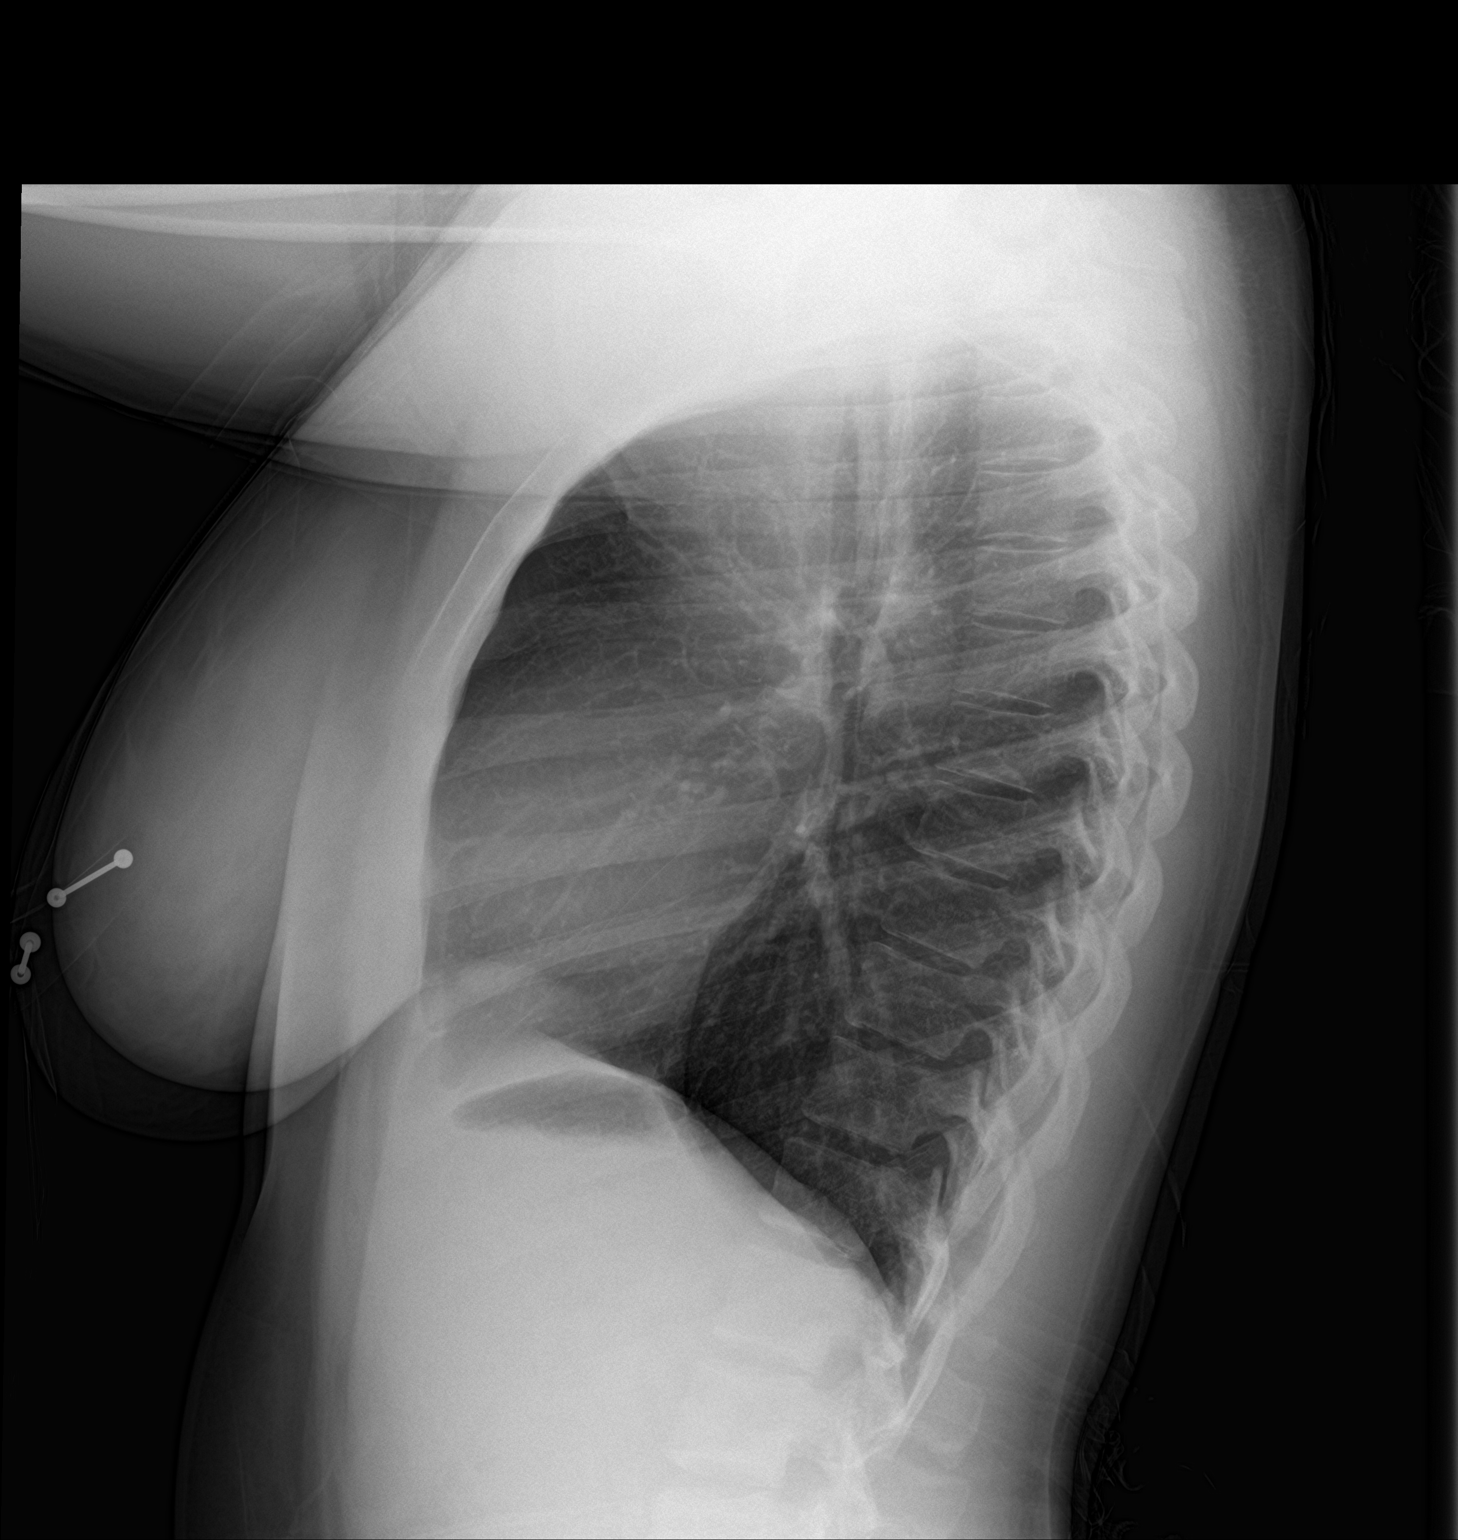

[2 of 2 positions shown; findings below may reference images not displayed]

FINDINGS: The heart size and mediastinal contours are within normal limits.
Both lungs are clear. The visualized skeletal structures are
unremarkable.
IMPRESSION: No active cardiopulmonary disease.
